# Patient Record
Sex: Male | Born: 2005 | Hispanic: Yes | Marital: Single | Smoking: Never smoker
Health system: Southern US, Community
[De-identification: ages and names within clinical notes are randomized; demographics above are authoritative.]

---
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
## 2023-06-13 ENCOUNTER — Ambulatory Visit (INDEPENDENT_AMBULATORY_CARE_PROVIDER_SITE_OTHER): Payer: MEDICAID | Admitting: Pediatrics

## 2023-06-13 ENCOUNTER — Encounter: Payer: Self-pay | Admitting: Pediatrics

## 2023-06-13 ENCOUNTER — Other Ambulatory Visit (HOSPITAL_COMMUNITY)
Admission: RE | Admit: 2023-06-13 | Discharge: 2023-06-13 | Disposition: A | Discharge status: Home / Self Care | Payer: MEDICAID | Source: Ambulatory Visit | Attending: Pediatrics | Admitting: Pediatrics

## 2023-06-13 VITALS — BP 128/70 | HR 95 | Ht 66.54 in | Wt 150.4 lb

## 2023-06-13 DIAGNOSIS — Z113 Encounter for screening for infections with a predominantly sexual mode of transmission: Secondary | ICD-10-CM | POA: Diagnosis present

## 2023-06-13 DIAGNOSIS — Z1331 Encounter for screening for depression: Secondary | ICD-10-CM | POA: Diagnosis not present

## 2023-06-13 DIAGNOSIS — Z131 Encounter for screening for diabetes mellitus: Secondary | ICD-10-CM

## 2023-06-13 DIAGNOSIS — R030 Elevated blood-pressure reading, without diagnosis of hypertension: Secondary | ICD-10-CM

## 2023-06-13 DIAGNOSIS — Z1339 Encounter for screening examination for other mental health and behavioral disorders: Secondary | ICD-10-CM

## 2023-06-13 DIAGNOSIS — Z114 Encounter for screening for human immunodeficiency virus [HIV]: Secondary | ICD-10-CM | POA: Diagnosis not present

## 2023-06-13 DIAGNOSIS — F840 Autistic disorder: Secondary | ICD-10-CM | POA: Diagnosis not present

## 2023-06-13 DIAGNOSIS — Z13220 Encounter for screening for lipoid disorders: Secondary | ICD-10-CM

## 2023-06-13 DIAGNOSIS — Z0001 Encounter for general adult medical examination with abnormal findings: Secondary | ICD-10-CM

## 2023-06-13 DIAGNOSIS — Z0000 Encounter for general adult medical examination without abnormal findings: Secondary | ICD-10-CM

## 2023-06-13 DIAGNOSIS — Z68.41 Body mass index (BMI) pediatric, 5th percentile to less than 85th percentile for age: Secondary | ICD-10-CM | POA: Diagnosis not present

## 2023-06-13 LAB — POCT RAPID HIV: Rapid HIV, POC: NEGATIVE

## 2023-06-13 NOTE — Progress Notes (Signed)
 Adolescent Well Care Visit Steve Fernandez is a 18 y.o. male who is here for well care.     PCP:  Jonetta Osgood, MD   History was provided by the patient.  Confidentiality was discussed with the patient and, if applicable, with caregiver as well. Patient's personal or confidential phone number:    Current Issues: Current concerns include   None - doing well.   Nutrition: Nutrition/Eating Behaviors: eats variety -  Adequate calcium in diet?: yes Supplements/ Vitamins: none  Exercise/ Media: Play any Sports?:  none Exercise:  not active Screen Time:  < 2 hours Media Rules or Monitoring?: yes  Sleep:  Sleep: adequate  Social Screening: Lives with:  mother, younger brothers Parental relations:  good Concerns regarding behavior with peers?  no Stressors of note: no  Education: School Name: graduated   Patient has a Dealer home: yes   Confidential social history: Tobacco?  no Secondhand smoke exposure?  no Drugs/ETOH?  no  Sexually Active?  no   Pregnancy Prevention:    Safe at home, in school & in relationships?  Yes Safe to self?  Yes   Screenings:  The patient completed the Rapid Assessment for Adolescent Preventive Services screening questionnaire and the following topics were identified as risk factors and discussed: mental health issues  In addition, the following topics were discussed as part of anticipatory guidance healthy eating, exercise, and mental health issues.  PHQ-9 completed and results indicated mildly elevated  Physical Exam:  Vitals:   06/13/23 1459  BP: 128/70  Pulse: 95  SpO2: 98%  Weight: 150 lb 6.4 oz (68.2 kg)  Height: 5' 6.54" (1.69 m)   BP 128/70 (BP Location: Right Arm, Patient Position: Sitting, Cuff Size: Normal)   Pulse 95   Ht 5' 6.54" (1.69 m)   Wt 150 lb 6.4 oz (68.2 kg)   SpO2 98%   BMI 23.89 kg/m  Body mass index: body mass index is 23.89 kg/m. Blood pressure %iles are not available for patients who are 18  years or older.  Hearing Screening  Method: Audiometry   500Hz  1000Hz  2000Hz  4000Hz   Right ear 20 20 20 20   Left ear 20 20 20 20    Vision Screening   Right eye Left eye Both eyes  Without correction 20/40 20/40 20/25   With correction       Physical Exam Vitals and nursing note reviewed.  Constitutional:      General: He is not in acute distress.    Appearance: He is well-developed.  HENT:     Head: Normocephalic.     Right Ear: External ear normal.     Left Ear: External ear normal.     Nose: Nose normal.     Mouth/Throat:     Pharynx: No oropharyngeal exudate.  Eyes:     Conjunctiva/sclera: Conjunctivae normal.     Pupils: Pupils are equal, round, and reactive to light.  Neck:     Thyroid: No thyromegaly.  Cardiovascular:     Rate and Rhythm: Normal rate.     Heart sounds: Normal heart sounds. No murmur heard. Pulmonary:     Effort: Pulmonary effort is normal.     Breath sounds: Normal breath sounds.  Abdominal:     General: Bowel sounds are normal.     Palpations: Abdomen is soft. There is no mass.     Tenderness: There is no abdominal tenderness.     Hernia: There is no hernia in the left inguinal area.  Genitourinary:    Penis: Normal.      Testes: Normal.        Right: Mass not present. Right testis is descended.        Left: Mass not present. Left testis is descended.  Musculoskeletal:        General: Normal range of motion.     Cervical back: Normal range of motion and neck supple.  Lymphadenopathy:     Cervical: No cervical adenopathy.  Skin:    General: Skin is warm and dry.     Findings: No rash.  Neurological:     Mental Status: He is alert and oriented to person, place, and time.     Cranial Nerves: No cranial nerve deficit.      Assessment and Plan:   1. Encounter for general adult medical examination without abnormal findings (Primary)  2. Screening examination for venereal disease - Urine cytology ancillary only  3. Encounter for  screening for human immunodeficiency virus (HIV) - POCT Rapid HIV  4. Body mass index, pediatric, 5th percentile to less than 85th percentile for age Healthy habits reviewed - encourage phsyical activity - Comprehensive metabolic panel with GFR  5. Screening for lipid disorders - Lipid panel  6. Screening for diabetes mellitus - Hemoglobin A1c  7. Autism spectrum Working with a therapist Encouraged him to work towards more education or employment  8. Elevated blood pressure reading Repeated and the same, presented as very nervous today so possibly some component of white coat hypertension. Lifestyle changes reviewed Will plan to follow up and recheck in one month   BMI is appropriate for age  Hearing screening result:normal Vision screening result: normal  Counseling provided for all of the vaccine components  Orders Placed This Encounter  Procedures   Comprehensive metabolic panel with GFR   Hemoglobin A1c   Lipid panel   POCT Rapid HIV   Recheck blood pressure in one month   No follow-ups on file.Dory Peru, MD

## 2023-06-13 NOTE — Patient Instructions (Signed)
 Please talk with your therapist about working on relaxation strategies.   Work on more regular exercise. We will recheck your blood pressure in one month

## 2023-06-14 LAB — COMPREHENSIVE METABOLIC PANEL WITH GFR
AG Ratio: 1.7 (calc) (ref 1.0–2.5)
ALT: 25 U/L (ref 8–46)
AST: 17 U/L (ref 12–32)
Albumin: 5 g/dL (ref 3.6–5.1)
Alkaline phosphatase (APISO): 109 U/L (ref 46–169)
BUN: 9 mg/dL (ref 7–20)
CO2: 23 mmol/L (ref 20–32)
Calcium: 10.2 mg/dL (ref 8.9–10.4)
Chloride: 106 mmol/L (ref 98–110)
Creat: 0.8 mg/dL (ref 0.60–1.24)
Globulin: 3 g/dL (ref 2.1–3.5)
Glucose, Bld: 86 mg/dL (ref 65–99)
Potassium: 3.7 mmol/L — ABNORMAL LOW (ref 3.8–5.1)
Sodium: 141 mmol/L (ref 135–146)
Total Bilirubin: 0.6 mg/dL (ref 0.2–1.1)
Total Protein: 8 g/dL (ref 6.3–8.2)
eGFR: 131 mL/min/{1.73_m2} (ref >=60)

## 2023-06-14 LAB — URINE CYTOLOGY ANCILLARY ONLY
Chlamydia: NEGATIVE
Comment: NEGATIVE
Comment: NORMAL
Neisseria Gonorrhea: NEGATIVE

## 2023-06-14 LAB — HEMOGLOBIN A1C
Hgb A1c MFr Bld: 5 %{Hb} (ref <5.7)
Mean Plasma Glucose: 97 mg/dL
eAG (mmol/L): 5.4 mmol/L

## 2023-06-14 LAB — LIPID PANEL
Cholesterol: 164 mg/dL (ref <170)
HDL: 41 mg/dL — ABNORMAL LOW (ref >45)
LDL Cholesterol (Calc): 97 mg/dL (ref <110)
Non-HDL Cholesterol (Calc): 123 mg/dL — ABNORMAL HIGH (ref <120)
Total CHOL/HDL Ratio: 4 (calc) (ref <5.0)
Triglycerides: 160 mg/dL — ABNORMAL HIGH (ref <90)

## 2023-07-13 ENCOUNTER — Ambulatory Visit (INDEPENDENT_AMBULATORY_CARE_PROVIDER_SITE_OTHER): Payer: MEDICAID | Admitting: Pediatrics

## 2023-07-13 VITALS — BP 122/72 | Ht 66.61 in | Wt 150.2 lb

## 2023-07-13 DIAGNOSIS — R030 Elevated blood-pressure reading, without diagnosis of hypertension: Secondary | ICD-10-CM | POA: Diagnosis not present

## 2023-07-13 DIAGNOSIS — F840 Autistic disorder: Secondary | ICD-10-CM | POA: Diagnosis not present

## 2023-07-13 NOTE — Progress Notes (Signed)
  Subjective:    Steve Fernandez is a 18 y.o. old male here  for Follow-up .    HPI Here to follow up blood pressure  Gets nervous/worried easily At last visit, thgout to be due to white coat hypertension Labs done at last visit and normal  Review of Systems  Constitutional:  Negative for activity change and appetite change.  HENT:  Negative for trouble swallowing.   Neurological:  Negative for syncope and headaches.       Objective:    BP 122/72   Ht 5' 6.61" (1.692 m)   Wt 150 lb 3.2 oz (68.1 kg)   BMI 23.80 kg/m  Physical Exam Constitutional:      Appearance: Normal appearance.  Cardiovascular:     Rate and Rhythm: Normal rate and regular rhythm.  Pulmonary:     Effort: Pulmonary effort is normal.     Breath sounds: Normal breath sounds.  Abdominal:     Palpations: Abdomen is soft.  Neurological:     Mental Status: He is alert.        Assessment and Plan:     Steve Fernandez was seen today for Follow-up .   Problem List Items Addressed This Visit     Autism spectrum   Other Visit Diagnoses       Elevated blood pressure reading    -  Primary      Blood pressure improved on today's visit. Still seems visibly nervous/worried on exam.  Continues to work with therapist Encouraged physical activity  PRN follow up  No follow-ups on file.  Alvena Aurora, MD

## 2024-02-14 ENCOUNTER — Telehealth: Payer: Self-pay | Admitting: Pediatrics

## 2024-02-14 NOTE — Telephone Encounter (Signed)
 A medical records request was received from [employer] recruiting Service and emailed to the HIM Department - ROI Team.
